# Patient Record
Sex: Female | Born: 1955 | Race: White | Hispanic: No | Marital: Married | Smoking: Never smoker
Health system: Southern US, Community
[De-identification: ages and names within clinical notes are randomized; demographics above are authoritative.]

## PROBLEM LIST (undated history)

## (undated) DIAGNOSIS — M549 Dorsalgia, unspecified: Secondary | ICD-10-CM

## (undated) DIAGNOSIS — L719 Rosacea, unspecified: Secondary | ICD-10-CM

## (undated) DIAGNOSIS — G8929 Other chronic pain: Secondary | ICD-10-CM

## (undated) HISTORY — DX: Rosacea, unspecified: L71.9

## (undated) HISTORY — DX: Dorsalgia, unspecified: M54.9

## (undated) HISTORY — DX: Other chronic pain: G89.29

---

## 1998-12-25 ENCOUNTER — Ambulatory Visit (HOSPITAL_COMMUNITY): Admission: RE | Admit: 1998-12-25 | Discharge: 1998-12-25 | Payer: Self-pay | Admitting: Gastroenterology

## 2000-06-13 DIAGNOSIS — G8929 Other chronic pain: Secondary | ICD-10-CM

## 2000-06-13 HISTORY — DX: Other chronic pain: G89.29

## 2000-06-13 HISTORY — PX: OTHER SURGICAL HISTORY: SHX169

## 2001-06-13 HISTORY — PX: OTHER SURGICAL HISTORY: SHX169

## 2001-07-26 ENCOUNTER — Ambulatory Visit (HOSPITAL_COMMUNITY): Admission: RE | Admit: 2001-07-26 | Discharge: 2001-07-26 | Payer: Self-pay | Admitting: Gastroenterology

## 2001-12-19 ENCOUNTER — Encounter: Payer: Self-pay | Admitting: Internal Medicine

## 2001-12-19 ENCOUNTER — Encounter: Admission: RE | Admit: 2001-12-19 | Discharge: 2001-12-19 | Payer: Self-pay | Admitting: Internal Medicine

## 2002-01-16 ENCOUNTER — Ambulatory Visit (HOSPITAL_COMMUNITY): Admission: RE | Admit: 2002-01-16 | Discharge: 2002-01-16 | Payer: Self-pay | Admitting: Neurosurgery

## 2002-01-16 ENCOUNTER — Encounter: Payer: Self-pay | Admitting: Neurosurgery

## 2002-01-24 ENCOUNTER — Encounter: Payer: Self-pay | Admitting: Neurosurgery

## 2002-01-24 ENCOUNTER — Ambulatory Visit (HOSPITAL_COMMUNITY): Admission: RE | Admit: 2002-01-24 | Discharge: 2002-01-25 | Payer: Self-pay | Admitting: Neurosurgery

## 2006-09-13 ENCOUNTER — Ambulatory Visit (HOSPITAL_COMMUNITY): Admission: RE | Admit: 2006-09-13 | Discharge: 2006-09-13 | Payer: Self-pay | Admitting: Gastroenterology

## 2012-11-23 ENCOUNTER — Encounter: Payer: Self-pay | Admitting: Nurse Practitioner

## 2012-11-28 ENCOUNTER — Encounter: Payer: Self-pay | Admitting: Nurse Practitioner

## 2012-11-28 ENCOUNTER — Ambulatory Visit (INDEPENDENT_AMBULATORY_CARE_PROVIDER_SITE_OTHER): Payer: BC Managed Care – PPO | Admitting: Nurse Practitioner

## 2012-11-28 VITALS — BP 118/79 | HR 71 | Ht 64.5 in | Wt 155.0 lb

## 2012-11-28 DIAGNOSIS — G544 Lumbosacral root disorders, not elsewhere classified: Secondary | ICD-10-CM

## 2012-11-28 MED ORDER — GABAPENTIN 300 MG PO CAPS: 300.0000 mg | ORAL_CAPSULE | Freq: Every day | ORAL | Status: DC

## 2012-11-28 NOTE — Progress Notes (Signed)
HPI: Patient returns for followup after last visit 06/30/2011. She is a previous patient of Dr. Sandria Manly. She has history of left L3-L4 hemilaminectomy and microdiskectomy in August of 2003. She had improvement in her symptoms for 2 months then she began having pain in her left lower back and into her left leg and thigh and also a sharp burning pain along the inner aspect of the left knee. This residual pain is described as a dull quality in the low back but a sharp and burning quality in the left knee. She was started on Neurontin at that time which has improved her pain. Repeat MRI of the lumbar spine did not show evidence of recurrent disc, she has not had symptoms in the right leg. She denies that she has had a flareup of her symptomatology in the last year. Her symptoms do return if she tries to taper her Neurontin. Unfortunately her 56 year old daughter died unexpectedly after a month hospitalization. She gained weight during this time and has been unable to get it off. She denies any loss of bowel or bladder control, she has not fallen, she has not had any difficulty sleeping. She continues to be careful with her body mechanics.  ROS:  negative  Physical Exam General: well developed, well nourished, seated, in no evident distress Head: head normocephalic and atraumatic. Oropharynx benign Neck: supple with no carotid  bruits Cardiovascular: regular rate and rhythm, no murmurs  Neurologic Exam Mental Status: Awake and fully alert. Oriented to place and time. Follows all commands. Speech and language normal.   Cranial Nerves:  Pupils equal, briskly reactive to light. Extraocular movements full without nystagmus. Visual fields full to confrontation. Hearing intact and symmetric to finger snap. Facial sensation intact. Face, tongue, palate move normally and symmetrically. Neck flexion and extension normal.  Motor: Normal bulk and tone. Normal strength in all tested extremity muscles.No focal weakness.  Forward bending and rotation does not elicit pain Sensory.: intact to touch and pinprick and vibratory.  Coordination: Rapid alternating movements normal in all extremities. Finger-to-nose and heel-to-shin performed accurately bilaterally. Gait and Station: Arises from chair without difficulty. Stance is normal. Gait demonstrates normal stride length and balance . Able to heel, toe and tandem walk without difficulty.  Reflexes: 1+ and symmetric except absent left patellar,  toes downgoing.     ASSESSMENT: Left L3 radiculopathy in good control     PLAN: Reviewed labs from 09/21/2012 Continue Neurontin at current dose will refill  Will be assigned to Dr. Terrace Arabia F/U yearly  Nilda Riggs, [place] APRN

## 2012-11-28 NOTE — Patient Instructions (Addendum)
Reviewed labs from 09/21/2012 Continue Neurontin at current dose will refill  Will be assigned to Dr. Terrace Arabia F/U yearly

## 2012-12-18 ENCOUNTER — Other Ambulatory Visit: Payer: Self-pay

## 2012-12-18 MED ORDER — GABAPENTIN 300 MG PO CAPS: 300.0000 mg | ORAL_CAPSULE | Freq: Every day | ORAL | Status: DC

## 2013-11-28 ENCOUNTER — Encounter: Payer: Self-pay | Admitting: Neurology

## 2013-11-28 ENCOUNTER — Ambulatory Visit (INDEPENDENT_AMBULATORY_CARE_PROVIDER_SITE_OTHER): Payer: BC Managed Care – PPO | Admitting: Neurology

## 2013-11-28 VITALS — BP 137/93 | HR 62 | Ht 64.0 in | Wt 148.0 lb

## 2013-11-28 DIAGNOSIS — G544 Lumbosacral root disorders, not elsewhere classified: Secondary | ICD-10-CM

## 2013-11-28 MED ORDER — DULOXETINE HCL 30 MG PO CPEP: ORAL_CAPSULE | ORAL | Status: DC

## 2013-11-28 NOTE — Progress Notes (Signed)
HPI: Patient returns for followup last clinical visit was with Eber Jonesarolyn in June 2014  She is a previous patient of Dr. Sandria ManlyLove. She has history of left L3-L4 hemilaminectomy and microdiskectomy in August of 2003. She had improvement in her symptoms for 2 months then she began having pain in her left lower back and into her left leg and thigh and also a sharp burning pain along the inner aspect of the left knee.   This residual pain is described as a dull quality in the low back but a sharp and burning quality in the left knee. She was started on Neurontin at that time which has improved her pain.   Repeat MRI of the lumbar spine did not show evidence of recurrent disc, she has not had symptoms in the right leg.   Her symptoms do return if she tries to taper her Neurontin.  She is now taking Neurontin 300mg  qhs,   She no longer has left knee pain, she only get left knee pain when she has recurrent low back pain.  She also has PMHx of HLD, she took xanax when she has to fly.    Physical Exam General: well developed, well nourished, seated, in no evident distress Head: head normocephalic and atraumatic. Oropharynx benign Neck: supple with no carotid  bruits Cardiovascular: regular rate and rhythm, no murmurs  Neurologic Exam Mental Status: Awake and fully alert. Oriented to place and time. Follows all commands. Speech and language normal.   Cranial Nerves:  Pupils equal, briskly reactive to light. Extraocular movements full without nystagmus. Visual fields full to confrontation. Hearing intact and symmetric to finger snap. Facial sensation intact. Face, tongue, palate move normally and symmetrically. Neck flexion and extension normal.  Motor: Normal bulk and tone. Normal strength in all tested extremity muscles.No focal weakness. Forward bending and rotation does not elicit pain Sensory.: intact to touch and pinprick and vibratory.  Coordination: Rapid alternating movements normal in all  extremities. Finger-to-nose and heel-to-shin performed accurately bilaterally. Gait and Station: Arises from chair without difficulty. Stance is normal. Gait demonstrates normal stride length and balance . Able to heel, toe and tandem walk without difficulty.  Reflexes: 1+ and symmetric except absent left patellar,  toes downgoing.   ASSESSMENT: Left L3 radiculopathy in good control, she is also dealing with a lot of stress, taking Xanax as needed, Will change her to Cymbalta in replace of gabapentin a prescription of 12 months, continue refill with her primary care physician

## 2013-12-05 ENCOUNTER — Telehealth: Payer: Self-pay | Admitting: Neurology

## 2013-12-06 NOTE — Telephone Encounter (Addendum)
Called patient and left message for call back as to why medication Cymbalta is not working for her.  Patient said that she got a new script, awaken at 3:30, super jittery, felt as though her throat was closing She has discontinued and is fine now

## 2013-12-09 ENCOUNTER — Telehealth: Payer: Self-pay | Admitting: Neurology

## 2013-12-09 MED ORDER — GABAPENTIN 300 MG PO CAPS: 300.0000 mg | ORAL_CAPSULE | Freq: Every day | ORAL | Status: AC

## 2013-12-09 NOTE — Telephone Encounter (Signed)
I have left message to her husband.  She stated that she had a reaction to cymbalta, trouble sleeping,  She has stopped cymbalta, it is ok to go back on neurontin, she is advised to call us back for new issues.

## 2013-12-09 NOTE — Telephone Encounter (Signed)
she does need a refill on the Neurontin

## 2015-04-17 ENCOUNTER — Ambulatory Visit
Admission: RE | Admit: 2015-04-17 | Discharge: 2015-04-17 | Disposition: A | Discharge status: Home / Self Care | Payer: 59 | Source: Ambulatory Visit | Attending: Family Medicine | Admitting: Family Medicine

## 2015-04-17 ENCOUNTER — Other Ambulatory Visit: Payer: Self-pay | Admitting: Family Medicine

## 2015-04-17 DIAGNOSIS — S93401A Sprain of unspecified ligament of right ankle, initial encounter: Secondary | ICD-10-CM

## 2015-06-10 DIAGNOSIS — R21 Rash and other nonspecific skin eruption: Secondary | ICD-10-CM | POA: Insufficient documentation

## 2015-06-17 ENCOUNTER — Ambulatory Visit (HOSPITAL_BASED_OUTPATIENT_CLINIC_OR_DEPARTMENT_OTHER)
Admission: RE | Admit: 2015-06-17 | Discharge: 2015-06-17 | Disposition: A | Discharge status: Home / Self Care | Payer: BLUE CROSS/BLUE SHIELD | Source: Ambulatory Visit | Attending: Family Medicine | Admitting: Family Medicine

## 2015-06-17 ENCOUNTER — Encounter: Payer: Self-pay | Admitting: Family Medicine

## 2015-06-17 ENCOUNTER — Ambulatory Visit (INDEPENDENT_AMBULATORY_CARE_PROVIDER_SITE_OTHER): Payer: BLUE CROSS/BLUE SHIELD | Admitting: Family Medicine

## 2015-06-17 VITALS — Ht 64.0 in | Wt 150.0 lb

## 2015-06-17 DIAGNOSIS — M25552 Pain in left hip: Secondary | ICD-10-CM

## 2015-06-17 DIAGNOSIS — R1030 Lower abdominal pain, unspecified: Secondary | ICD-10-CM | POA: Insufficient documentation

## 2015-06-17 NOTE — Patient Instructions (Signed)
I'm concerned you tore the labrum of your hip. Get the x-rays as you leave today. We will call you with the results and next steps (likely intraarticular cortisone injection). MR arthrogram, repeating physical therapy are options.

## 2015-06-18 DIAGNOSIS — M25552 Pain in left hip: Secondary | ICD-10-CM | POA: Insufficient documentation

## 2015-06-18 NOTE — Assessment & Plan Note (Signed)
independently reviewed radiographs showing no abnormalities to account for pain and very minimal DJD.  Consistent with labral tear.  Unfortunately she has already done extensive physical therapy and nsaids without benefit.  Discussed options: repeating PT, intraarticular injection, MR arthrogram.  She would like to try intraarticular injection - will go ahead with this.

## 2015-06-18 NOTE — Progress Notes (Signed)
PCP and consultation requested by: Crystal Cummings,Crystal S, MD  Subjective:   HPI: [name] here for left groin pain.  Patient reports back in October of 2015 when starting a run she felt a pull/pain in left groin area. Tried to run through it - pain intensified by next day and had difficulty bearing weight. Never completely improved. Pain still in left groin anteriorly, pain level 1/10 at rest but up to 6/10 at worst and sharp. Worse with any type of exercising. No skin changes, fever. Did physical therapy for 3 months. Tried naprosyn.  Past Medical History  Diagnosis Date  . Chronic back pain 2002  . Acne rosacea     Current Outpatient Prescriptions on File Prior to Visit  Medication Sig Dispense Refill  . ALPRAZolam (XANAX) 0.5 MG tablet 0.5 mg as needed.    Marland Kitchen. atorvastatin (LIPITOR) 10 MG tablet Take 10 mg by mouth daily.    . Dapsone 5 % topical gel Apply 5 % topically daily.    Marland Kitchen. gabapentin (NEURONTIN) 300 MG capsule Take 1 capsule (300 mg total) by mouth daily. 90 capsule 4  . minocycline (MINOCIN,DYNACIN) 100 MG capsule Take 100 mg by mouth daily.     No current facility-administered medications on file prior to visit.    Past Surgical History  Procedure Laterality Date  . Pinched nerve  2002  . Microdisectomy  2003    L3 L4    Allergies  Allergen Reactions  . Sulfa Antibiotics     Social History   Social History  . Marital Status: Married    Spouse Name: Crystal Cummings  . Number of Children: 2  . Years of Education: college   Occupational History  . Not on file.   Social History Main Topics  . Smoking status: Never Smoker   . Smokeless tobacco: Never Used  . Alcohol Use: 0.6 oz/week    1 Glasses of wine per week     Comment: 3 times a week  . Drug Use: No  . Sexual Activity: Not on file   Other Topics Concern  . Not on file   Social History Narrative   Patient lives at home with her husband.   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    Patient has a Scientist, research (physical sciences)BSN. Patient has 2 children.     Family History  Problem Relation Age of Onset  . Mitral valve prolapse Father   . Cancer Mother   . Heart disease Father   . Diabetes Mother   . Stroke      grandmother     Ht 5\' 4"  (1.626 m)  Wt 150 lb (68.04 kg)  BMI 25.73 kg/m2  Review of Systems: See HPI above.    Objective:  Physical Exam:  Gen: NAD  Back: No gross deformity, scoliosis. No TTP .  No midline or bony TTP. FROM. Strength LEs 5/5 all muscle groups without reproduction of pain.  2+ MSRs in patellar and achilles tendons, equal bilaterally. Negative SLRs. Sensation intact to light touch bilaterally.  Left hip: No gross deformity, swelling. No focal TTP. FROM with pain on internal rotation. Negative fabers and piriformis. Positive logroll.    Assessment & Plan:  1. Left hip pain - independently reviewed radiographs showing no abnormalities to account for pain and very minimal DJD.  Consistent with labral tear.  Unfortunately she has already done extensive physical therapy and nsaids without benefit.  Discussed  options: repeating PT, intraarticular injection, MR arthrogram.  She would like to try intraarticular injection - will go ahead with this.

## 2015-06-19 ENCOUNTER — Other Ambulatory Visit: Payer: Self-pay | Admitting: Family Medicine

## 2015-06-19 DIAGNOSIS — M25551 Pain in right hip: Secondary | ICD-10-CM

## 2015-06-29 ENCOUNTER — Other Ambulatory Visit: Payer: Self-pay | Admitting: Otolaryngology

## 2015-07-01 ENCOUNTER — Ambulatory Visit
Admission: RE | Admit: 2015-07-01 | Discharge: 2015-07-01 | Disposition: A | Discharge status: Home / Self Care | Payer: BLUE CROSS/BLUE SHIELD | Source: Ambulatory Visit | Attending: Family Medicine | Admitting: Family Medicine

## 2015-07-01 ENCOUNTER — Other Ambulatory Visit: Payer: Self-pay

## 2015-07-01 DIAGNOSIS — E785 Hyperlipidemia, unspecified: Secondary | ICD-10-CM | POA: Insufficient documentation

## 2015-07-01 DIAGNOSIS — N390 Urinary tract infection, site not specified: Secondary | ICD-10-CM | POA: Insufficient documentation

## 2015-07-01 DIAGNOSIS — M25551 Pain in right hip: Secondary | ICD-10-CM

## 2015-07-01 DIAGNOSIS — M5417 Radiculopathy, lumbosacral region: Secondary | ICD-10-CM | POA: Insufficient documentation

## 2015-07-01 MED ORDER — METHYLPREDNISOLONE ACETATE 40 MG/ML INJ SUSP (RADIOLOG
120.0000 mg | Freq: Once | INTRAMUSCULAR | Status: AC
  Administered 2015-07-01: 120 mg via EPIDURAL

## 2015-07-01 MED ORDER — IOHEXOL 180 MG/ML  SOLN
1.0000 mL | Freq: Once | INTRAMUSCULAR | Status: AC | PRN
  Administered 2015-07-01: 1 mL via INTRAVENOUS

## 2017-06-19 DIAGNOSIS — Z800 Family history of malignant neoplasm of digestive organs: Secondary | ICD-10-CM | POA: Diagnosis not present

## 2017-06-19 DIAGNOSIS — Z86010 Personal history of colonic polyps: Secondary | ICD-10-CM | POA: Diagnosis not present

## 2017-07-21 DIAGNOSIS — M62830 Muscle spasm of back: Secondary | ICD-10-CM | POA: Diagnosis not present

## 2017-07-21 DIAGNOSIS — M2550 Pain in unspecified joint: Secondary | ICD-10-CM | POA: Diagnosis not present

## 2017-07-21 DIAGNOSIS — M545 Low back pain: Secondary | ICD-10-CM | POA: Diagnosis not present

## 2017-07-25 DIAGNOSIS — M545 Low back pain: Secondary | ICD-10-CM | POA: Diagnosis not present

## 2017-07-25 DIAGNOSIS — M2550 Pain in unspecified joint: Secondary | ICD-10-CM | POA: Diagnosis not present

## 2017-07-25 DIAGNOSIS — M62830 Muscle spasm of back: Secondary | ICD-10-CM | POA: Diagnosis not present

## 2017-07-27 DIAGNOSIS — M545 Low back pain: Secondary | ICD-10-CM | POA: Diagnosis not present

## 2017-07-27 DIAGNOSIS — M62830 Muscle spasm of back: Secondary | ICD-10-CM | POA: Diagnosis not present

## 2017-07-27 DIAGNOSIS — M2550 Pain in unspecified joint: Secondary | ICD-10-CM | POA: Diagnosis not present

## 2017-07-28 DIAGNOSIS — M545 Low back pain: Secondary | ICD-10-CM | POA: Diagnosis not present

## 2017-07-28 DIAGNOSIS — M62830 Muscle spasm of back: Secondary | ICD-10-CM | POA: Diagnosis not present

## 2017-07-28 DIAGNOSIS — M2550 Pain in unspecified joint: Secondary | ICD-10-CM | POA: Diagnosis not present

## 2017-08-01 DIAGNOSIS — M545 Low back pain: Secondary | ICD-10-CM | POA: Diagnosis not present

## 2017-08-01 DIAGNOSIS — M62830 Muscle spasm of back: Secondary | ICD-10-CM | POA: Diagnosis not present

## 2017-08-01 DIAGNOSIS — M2550 Pain in unspecified joint: Secondary | ICD-10-CM | POA: Diagnosis not present

## 2017-08-03 DIAGNOSIS — M2550 Pain in unspecified joint: Secondary | ICD-10-CM | POA: Diagnosis not present

## 2017-08-03 DIAGNOSIS — M545 Low back pain: Secondary | ICD-10-CM | POA: Diagnosis not present

## 2017-08-03 DIAGNOSIS — M62830 Muscle spasm of back: Secondary | ICD-10-CM | POA: Diagnosis not present

## 2017-08-08 DIAGNOSIS — M62830 Muscle spasm of back: Secondary | ICD-10-CM | POA: Diagnosis not present

## 2017-08-08 DIAGNOSIS — M2550 Pain in unspecified joint: Secondary | ICD-10-CM | POA: Diagnosis not present

## 2017-08-08 DIAGNOSIS — M545 Low back pain: Secondary | ICD-10-CM | POA: Diagnosis not present

## 2017-08-11 DIAGNOSIS — M2550 Pain in unspecified joint: Secondary | ICD-10-CM | POA: Diagnosis not present

## 2017-08-11 DIAGNOSIS — M62830 Muscle spasm of back: Secondary | ICD-10-CM | POA: Diagnosis not present

## 2017-08-11 DIAGNOSIS — M545 Low back pain: Secondary | ICD-10-CM | POA: Diagnosis not present

## 2017-08-15 DIAGNOSIS — M62830 Muscle spasm of back: Secondary | ICD-10-CM | POA: Diagnosis not present

## 2017-08-15 DIAGNOSIS — M545 Low back pain: Secondary | ICD-10-CM | POA: Diagnosis not present

## 2017-08-15 DIAGNOSIS — M2550 Pain in unspecified joint: Secondary | ICD-10-CM | POA: Diagnosis not present

## 2017-10-17 DIAGNOSIS — R202 Paresthesia of skin: Secondary | ICD-10-CM | POA: Diagnosis not present

## 2017-12-06 DIAGNOSIS — L718 Other rosacea: Secondary | ICD-10-CM | POA: Diagnosis not present

## 2018-03-26 DIAGNOSIS — R350 Frequency of micturition: Secondary | ICD-10-CM | POA: Diagnosis not present

## 2018-03-26 DIAGNOSIS — R509 Fever, unspecified: Secondary | ICD-10-CM | POA: Diagnosis not present

## 2018-04-05 DIAGNOSIS — N3020 Other chronic cystitis without hematuria: Secondary | ICD-10-CM | POA: Diagnosis not present

## 2018-04-05 DIAGNOSIS — R350 Frequency of micturition: Secondary | ICD-10-CM | POA: Diagnosis not present

## 2018-04-06 DIAGNOSIS — E785 Hyperlipidemia, unspecified: Secondary | ICD-10-CM | POA: Diagnosis not present

## 2018-04-06 DIAGNOSIS — Z23 Encounter for immunization: Secondary | ICD-10-CM | POA: Diagnosis not present

## 2018-04-06 DIAGNOSIS — R202 Paresthesia of skin: Secondary | ICD-10-CM | POA: Diagnosis not present

## 2018-05-16 DIAGNOSIS — Z1231 Encounter for screening mammogram for malignant neoplasm of breast: Secondary | ICD-10-CM | POA: Diagnosis not present

## 2018-05-18 DIAGNOSIS — R351 Nocturia: Secondary | ICD-10-CM | POA: Diagnosis not present

## 2018-05-18 DIAGNOSIS — R8271 Bacteriuria: Secondary | ICD-10-CM | POA: Diagnosis not present

## 2018-05-18 DIAGNOSIS — N3020 Other chronic cystitis without hematuria: Secondary | ICD-10-CM | POA: Diagnosis not present

## 2018-07-04 DIAGNOSIS — N3020 Other chronic cystitis without hematuria: Secondary | ICD-10-CM | POA: Diagnosis not present

## 2018-07-04 DIAGNOSIS — R350 Frequency of micturition: Secondary | ICD-10-CM | POA: Diagnosis not present

## 2018-07-30 DIAGNOSIS — Z01419 Encounter for gynecological examination (general) (routine) without abnormal findings: Secondary | ICD-10-CM | POA: Diagnosis not present

## 2018-08-08 DIAGNOSIS — Z719 Counseling, unspecified: Secondary | ICD-10-CM | POA: Diagnosis not present

## 2018-08-15 DIAGNOSIS — Z719 Counseling, unspecified: Secondary | ICD-10-CM | POA: Diagnosis not present

## 2018-08-22 DIAGNOSIS — Z719 Counseling, unspecified: Secondary | ICD-10-CM | POA: Diagnosis not present

## 2018-09-03 DIAGNOSIS — Z719 Counseling, unspecified: Secondary | ICD-10-CM | POA: Diagnosis not present

## 2018-09-10 DIAGNOSIS — Z719 Counseling, unspecified: Secondary | ICD-10-CM | POA: Diagnosis not present

## 2018-09-12 DIAGNOSIS — Z719 Counseling, unspecified: Secondary | ICD-10-CM | POA: Diagnosis not present

## 2018-09-19 DIAGNOSIS — Z719 Counseling, unspecified: Secondary | ICD-10-CM | POA: Diagnosis not present

## 2018-09-26 DIAGNOSIS — Z719 Counseling, unspecified: Secondary | ICD-10-CM | POA: Diagnosis not present

## 2018-10-03 DIAGNOSIS — Z719 Counseling, unspecified: Secondary | ICD-10-CM | POA: Diagnosis not present

## 2018-10-08 DIAGNOSIS — R350 Frequency of micturition: Secondary | ICD-10-CM | POA: Diagnosis not present

## 2018-10-08 DIAGNOSIS — M7918 Myalgia, other site: Secondary | ICD-10-CM | POA: Diagnosis not present

## 2018-10-10 DIAGNOSIS — Z719 Counseling, unspecified: Secondary | ICD-10-CM | POA: Diagnosis not present

## 2018-10-17 DIAGNOSIS — Z719 Counseling, unspecified: Secondary | ICD-10-CM | POA: Diagnosis not present

## 2018-10-24 DIAGNOSIS — Z719 Counseling, unspecified: Secondary | ICD-10-CM | POA: Diagnosis not present

## 2018-10-31 DIAGNOSIS — Z719 Counseling, unspecified: Secondary | ICD-10-CM | POA: Diagnosis not present

## 2021-11-12 DIAGNOSIS — M25522 Pain in left elbow: Secondary | ICD-10-CM | POA: Diagnosis not present

## 2021-11-22 DIAGNOSIS — M25522 Pain in left elbow: Secondary | ICD-10-CM | POA: Diagnosis not present

## 2021-11-30 DIAGNOSIS — M7712 Lateral epicondylitis, left elbow: Secondary | ICD-10-CM | POA: Diagnosis not present

## 2021-12-02 DIAGNOSIS — H25041 Posterior subcapsular polar age-related cataract, right eye: Secondary | ICD-10-CM | POA: Diagnosis not present

## 2021-12-02 DIAGNOSIS — H52223 Regular astigmatism, bilateral: Secondary | ICD-10-CM | POA: Diagnosis not present

## 2021-12-02 DIAGNOSIS — H5203 Hypermetropia, bilateral: Secondary | ICD-10-CM | POA: Diagnosis not present

## 2021-12-02 DIAGNOSIS — H43811 Vitreous degeneration, right eye: Secondary | ICD-10-CM | POA: Diagnosis not present

## 2021-12-03 DIAGNOSIS — H524 Presbyopia: Secondary | ICD-10-CM | POA: Diagnosis not present

## 2021-12-15 DIAGNOSIS — Z1159 Encounter for screening for other viral diseases: Secondary | ICD-10-CM | POA: Diagnosis not present

## 2021-12-15 DIAGNOSIS — F419 Anxiety disorder, unspecified: Secondary | ICD-10-CM | POA: Diagnosis not present

## 2021-12-15 DIAGNOSIS — Z0000 Encounter for general adult medical examination without abnormal findings: Secondary | ICD-10-CM | POA: Diagnosis not present

## 2021-12-15 DIAGNOSIS — M5417 Radiculopathy, lumbosacral region: Secondary | ICD-10-CM | POA: Diagnosis not present

## 2021-12-15 DIAGNOSIS — E785 Hyperlipidemia, unspecified: Secondary | ICD-10-CM | POA: Diagnosis not present

## 2021-12-29 DIAGNOSIS — L718 Other rosacea: Secondary | ICD-10-CM | POA: Diagnosis not present

## 2022-01-24 DIAGNOSIS — Z780 Asymptomatic menopausal state: Secondary | ICD-10-CM | POA: Diagnosis not present

## 2022-01-24 DIAGNOSIS — E2839 Other primary ovarian failure: Secondary | ICD-10-CM | POA: Diagnosis not present

## 2022-06-03 DIAGNOSIS — R351 Nocturia: Secondary | ICD-10-CM | POA: Diagnosis not present

## 2022-06-03 DIAGNOSIS — N3020 Other chronic cystitis without hematuria: Secondary | ICD-10-CM | POA: Diagnosis not present

## 2022-07-18 DIAGNOSIS — Z01419 Encounter for gynecological examination (general) (routine) without abnormal findings: Secondary | ICD-10-CM | POA: Diagnosis not present

## 2022-07-26 DIAGNOSIS — K080 Exfoliation of teeth due to systemic causes: Secondary | ICD-10-CM | POA: Diagnosis not present

## 2022-08-05 DIAGNOSIS — K648 Other hemorrhoids: Secondary | ICD-10-CM | POA: Diagnosis not present

## 2022-08-05 DIAGNOSIS — Z86010 Personal history of colonic polyps: Secondary | ICD-10-CM | POA: Diagnosis not present

## 2022-08-05 DIAGNOSIS — Z09 Encounter for follow-up examination after completed treatment for conditions other than malignant neoplasm: Secondary | ICD-10-CM | POA: Diagnosis not present

## 2022-08-05 DIAGNOSIS — D128 Benign neoplasm of rectum: Secondary | ICD-10-CM | POA: Diagnosis not present

## 2022-08-05 DIAGNOSIS — D123 Benign neoplasm of transverse colon: Secondary | ICD-10-CM | POA: Diagnosis not present

## 2022-08-05 DIAGNOSIS — Z800 Family history of malignant neoplasm of digestive organs: Secondary | ICD-10-CM | POA: Diagnosis not present

## 2022-08-09 DIAGNOSIS — D128 Benign neoplasm of rectum: Secondary | ICD-10-CM | POA: Diagnosis not present

## 2022-08-09 DIAGNOSIS — D123 Benign neoplasm of transverse colon: Secondary | ICD-10-CM | POA: Diagnosis not present

## 2022-08-26 DIAGNOSIS — Z1231 Encounter for screening mammogram for malignant neoplasm of breast: Secondary | ICD-10-CM | POA: Diagnosis not present

## 2022-10-04 DIAGNOSIS — K080 Exfoliation of teeth due to systemic causes: Secondary | ICD-10-CM | POA: Diagnosis not present

## 2022-12-06 DIAGNOSIS — H25041 Posterior subcapsular polar age-related cataract, right eye: Secondary | ICD-10-CM | POA: Diagnosis not present

## 2022-12-06 DIAGNOSIS — H43813 Vitreous degeneration, bilateral: Secondary | ICD-10-CM | POA: Diagnosis not present

## 2022-12-21 ENCOUNTER — Telehealth (HOSPITAL_BASED_OUTPATIENT_CLINIC_OR_DEPARTMENT_OTHER): Payer: Self-pay

## 2022-12-21 ENCOUNTER — Other Ambulatory Visit (HOSPITAL_BASED_OUTPATIENT_CLINIC_OR_DEPARTMENT_OTHER): Payer: Self-pay | Admitting: Family Medicine

## 2022-12-21 DIAGNOSIS — E785 Hyperlipidemia, unspecified: Secondary | ICD-10-CM

## 2022-12-21 DIAGNOSIS — G720 Drug-induced myopathy: Secondary | ICD-10-CM | POA: Diagnosis not present

## 2022-12-21 DIAGNOSIS — N390 Urinary tract infection, site not specified: Secondary | ICD-10-CM | POA: Diagnosis not present

## 2022-12-21 DIAGNOSIS — F419 Anxiety disorder, unspecified: Secondary | ICD-10-CM | POA: Diagnosis not present

## 2022-12-21 DIAGNOSIS — Z0000 Encounter for general adult medical examination without abnormal findings: Secondary | ICD-10-CM | POA: Diagnosis not present

## 2022-12-26 ENCOUNTER — Other Ambulatory Visit: Payer: Self-pay | Admitting: Family Medicine

## 2022-12-26 ENCOUNTER — Ambulatory Visit: Admission: RE | Admit: 2022-12-26 | Payer: Medicare Other | Source: Ambulatory Visit

## 2022-12-26 DIAGNOSIS — M5417 Radiculopathy, lumbosacral region: Secondary | ICD-10-CM

## 2022-12-26 DIAGNOSIS — M25551 Pain in right hip: Secondary | ICD-10-CM

## 2022-12-26 DIAGNOSIS — M1611 Unilateral primary osteoarthritis, right hip: Secondary | ICD-10-CM | POA: Diagnosis not present

## 2022-12-28 ENCOUNTER — Ambulatory Visit (HOSPITAL_BASED_OUTPATIENT_CLINIC_OR_DEPARTMENT_OTHER)
Admission: RE | Admit: 2022-12-28 | Discharge: 2022-12-28 | Disposition: A | Discharge status: Home / Self Care | Payer: Self-pay | Source: Ambulatory Visit | Attending: Family Medicine | Admitting: Family Medicine

## 2022-12-28 DIAGNOSIS — E785 Hyperlipidemia, unspecified: Secondary | ICD-10-CM | POA: Insufficient documentation

## 2023-01-09 DIAGNOSIS — L718 Other rosacea: Secondary | ICD-10-CM | POA: Diagnosis not present

## 2023-01-09 DIAGNOSIS — L2089 Other atopic dermatitis: Secondary | ICD-10-CM | POA: Diagnosis not present

## 2023-01-26 DIAGNOSIS — K080 Exfoliation of teeth due to systemic causes: Secondary | ICD-10-CM | POA: Diagnosis not present

## 2023-02-01 DIAGNOSIS — H6990 Unspecified Eustachian tube disorder, unspecified ear: Secondary | ICD-10-CM | POA: Diagnosis not present

## 2023-02-02 DIAGNOSIS — M5136 Other intervertebral disc degeneration, lumbar region: Secondary | ICD-10-CM | POA: Diagnosis not present

## 2023-02-08 DIAGNOSIS — H6993 Unspecified Eustachian tube disorder, bilateral: Secondary | ICD-10-CM | POA: Diagnosis not present

## 2023-03-06 ENCOUNTER — Ambulatory Visit: Payer: Self-pay | Admitting: Cardiology

## 2023-03-06 DIAGNOSIS — M5136 Other intervertebral disc degeneration, lumbar region: Secondary | ICD-10-CM | POA: Diagnosis not present

## 2023-03-13 DIAGNOSIS — M5136 Other intervertebral disc degeneration, lumbar region: Secondary | ICD-10-CM | POA: Diagnosis not present

## 2023-03-20 DIAGNOSIS — M5136 Other intervertebral disc degeneration, lumbar region with discogenic back pain only: Secondary | ICD-10-CM | POA: Diagnosis not present

## 2023-03-28 DIAGNOSIS — M51362 Other intervertebral disc degeneration, lumbar region with discogenic back pain and lower extremity pain: Secondary | ICD-10-CM | POA: Diagnosis not present

## 2023-04-17 DIAGNOSIS — E789 Disorder of lipoprotein metabolism, unspecified: Secondary | ICD-10-CM | POA: Diagnosis not present

## 2023-04-17 DIAGNOSIS — R931 Abnormal findings on diagnostic imaging of heart and coronary circulation: Secondary | ICD-10-CM | POA: Diagnosis not present

## 2023-04-17 DIAGNOSIS — Z136 Encounter for screening for cardiovascular disorders: Secondary | ICD-10-CM | POA: Diagnosis not present

## 2023-06-15 DIAGNOSIS — M25562 Pain in left knee: Secondary | ICD-10-CM | POA: Diagnosis not present

## 2023-06-28 DIAGNOSIS — R350 Frequency of micturition: Secondary | ICD-10-CM | POA: Diagnosis not present

## 2023-06-28 DIAGNOSIS — N3020 Other chronic cystitis without hematuria: Secondary | ICD-10-CM | POA: Diagnosis not present

## 2023-07-06 DIAGNOSIS — M25562 Pain in left knee: Secondary | ICD-10-CM | POA: Diagnosis not present

## 2023-07-17 DIAGNOSIS — L570 Actinic keratosis: Secondary | ICD-10-CM | POA: Diagnosis not present

## 2023-07-17 DIAGNOSIS — L820 Inflamed seborrheic keratosis: Secondary | ICD-10-CM | POA: Diagnosis not present

## 2023-07-21 DIAGNOSIS — Z1231 Encounter for screening mammogram for malignant neoplasm of breast: Secondary | ICD-10-CM | POA: Diagnosis not present

## 2023-07-21 DIAGNOSIS — Z01419 Encounter for gynecological examination (general) (routine) without abnormal findings: Secondary | ICD-10-CM | POA: Diagnosis not present

## 2023-07-24 DIAGNOSIS — M25562 Pain in left knee: Secondary | ICD-10-CM | POA: Diagnosis not present

## 2023-07-31 DIAGNOSIS — K080 Exfoliation of teeth due to systemic causes: Secondary | ICD-10-CM | POA: Diagnosis not present

## 2023-08-01 DIAGNOSIS — M25562 Pain in left knee: Secondary | ICD-10-CM | POA: Diagnosis not present

## 2023-08-08 DIAGNOSIS — M25562 Pain in left knee: Secondary | ICD-10-CM | POA: Diagnosis not present

## 2023-08-15 DIAGNOSIS — M25562 Pain in left knee: Secondary | ICD-10-CM | POA: Diagnosis not present

## 2023-08-22 DIAGNOSIS — M25562 Pain in left knee: Secondary | ICD-10-CM | POA: Diagnosis not present

## 2023-08-29 DIAGNOSIS — M25562 Pain in left knee: Secondary | ICD-10-CM | POA: Diagnosis not present

## 2023-09-05 DIAGNOSIS — M25562 Pain in left knee: Secondary | ICD-10-CM | POA: Diagnosis not present

## 2023-09-12 DIAGNOSIS — Z1231 Encounter for screening mammogram for malignant neoplasm of breast: Secondary | ICD-10-CM | POA: Diagnosis not present

## 2023-09-19 DIAGNOSIS — K080 Exfoliation of teeth due to systemic causes: Secondary | ICD-10-CM | POA: Diagnosis not present

## 2023-10-16 DIAGNOSIS — E789 Disorder of lipoprotein metabolism, unspecified: Secondary | ICD-10-CM | POA: Diagnosis not present

## 2023-10-16 DIAGNOSIS — E7800 Pure hypercholesterolemia, unspecified: Secondary | ICD-10-CM | POA: Diagnosis not present

## 2023-10-16 DIAGNOSIS — R931 Abnormal findings on diagnostic imaging of heart and coronary circulation: Secondary | ICD-10-CM | POA: Diagnosis not present

## 2023-11-21 DIAGNOSIS — K080 Exfoliation of teeth due to systemic causes: Secondary | ICD-10-CM | POA: Diagnosis not present

## 2023-12-08 DIAGNOSIS — H25012 Cortical age-related cataract, left eye: Secondary | ICD-10-CM | POA: Diagnosis not present

## 2023-12-08 DIAGNOSIS — H43813 Vitreous degeneration, bilateral: Secondary | ICD-10-CM | POA: Diagnosis not present

## 2023-12-08 DIAGNOSIS — H25041 Posterior subcapsular polar age-related cataract, right eye: Secondary | ICD-10-CM | POA: Diagnosis not present

## 2024-01-10 DIAGNOSIS — L718 Other rosacea: Secondary | ICD-10-CM | POA: Diagnosis not present

## 2024-01-16 DIAGNOSIS — G720 Drug-induced myopathy: Secondary | ICD-10-CM | POA: Diagnosis not present

## 2024-01-16 DIAGNOSIS — F419 Anxiety disorder, unspecified: Secondary | ICD-10-CM | POA: Diagnosis not present

## 2024-01-16 DIAGNOSIS — Z0000 Encounter for general adult medical examination without abnormal findings: Secondary | ICD-10-CM | POA: Diagnosis not present

## 2024-01-16 DIAGNOSIS — E785 Hyperlipidemia, unspecified: Secondary | ICD-10-CM | POA: Diagnosis not present

## 2024-01-16 DIAGNOSIS — G629 Polyneuropathy, unspecified: Secondary | ICD-10-CM | POA: Diagnosis not present

## 2024-01-16 DIAGNOSIS — N390 Urinary tract infection, site not specified: Secondary | ICD-10-CM | POA: Diagnosis not present

## 2024-01-29 DIAGNOSIS — H903 Sensorineural hearing loss, bilateral: Secondary | ICD-10-CM | POA: Diagnosis not present

## 2024-02-05 DIAGNOSIS — K080 Exfoliation of teeth due to systemic causes: Secondary | ICD-10-CM | POA: Diagnosis not present

## 2024-03-04 DIAGNOSIS — H2512 Age-related nuclear cataract, left eye: Secondary | ICD-10-CM | POA: Diagnosis not present

## 2024-03-04 DIAGNOSIS — H25043 Posterior subcapsular polar age-related cataract, bilateral: Secondary | ICD-10-CM | POA: Diagnosis not present

## 2024-03-04 DIAGNOSIS — H2513 Age-related nuclear cataract, bilateral: Secondary | ICD-10-CM | POA: Diagnosis not present

## 2024-03-04 DIAGNOSIS — H43393 Other vitreous opacities, bilateral: Secondary | ICD-10-CM | POA: Diagnosis not present

## 2024-03-04 DIAGNOSIS — L719 Rosacea, unspecified: Secondary | ICD-10-CM | POA: Diagnosis not present

## 2024-04-08 DIAGNOSIS — H25041 Posterior subcapsular polar age-related cataract, right eye: Secondary | ICD-10-CM | POA: Diagnosis not present

## 2024-04-09 DIAGNOSIS — H2512 Age-related nuclear cataract, left eye: Secondary | ICD-10-CM | POA: Diagnosis not present

## 2024-04-09 DIAGNOSIS — H268 Other specified cataract: Secondary | ICD-10-CM | POA: Diagnosis not present

## 2024-04-09 DIAGNOSIS — F419 Anxiety disorder, unspecified: Secondary | ICD-10-CM | POA: Diagnosis not present

## 2024-04-09 DIAGNOSIS — H25041 Posterior subcapsular polar age-related cataract, right eye: Secondary | ICD-10-CM | POA: Diagnosis not present

## 2024-04-09 DIAGNOSIS — H2511 Age-related nuclear cataract, right eye: Secondary | ICD-10-CM | POA: Diagnosis not present

## 2024-04-16 DIAGNOSIS — H2512 Age-related nuclear cataract, left eye: Secondary | ICD-10-CM | POA: Diagnosis not present

## 2024-04-16 DIAGNOSIS — H2511 Age-related nuclear cataract, right eye: Secondary | ICD-10-CM | POA: Diagnosis not present

## 2024-05-06 DIAGNOSIS — R238 Other skin changes: Secondary | ICD-10-CM | POA: Diagnosis not present
